# Patient Record
Sex: Female | Born: 1950 | Race: Black or African American | Hispanic: No | Marital: Married | Smoking: Never smoker
Health system: Southern US, Community
[De-identification: ages and names within clinical notes are randomized; demographics above are authoritative.]

## PROBLEM LIST (undated history)

## (undated) DIAGNOSIS — G8929 Other chronic pain: Secondary | ICD-10-CM

## (undated) DIAGNOSIS — I10 Essential (primary) hypertension: Secondary | ICD-10-CM

## (undated) DIAGNOSIS — M25561 Pain in right knee: Secondary | ICD-10-CM

## (undated) HISTORY — PX: ABDOMINAL HYSTERECTOMY: SHX81

---

## 2008-04-11 ENCOUNTER — Ambulatory Visit: Payer: Self-pay

## 2014-12-28 ENCOUNTER — Emergency Department (HOSPITAL_COMMUNITY): Payer: Self-pay

## 2014-12-28 ENCOUNTER — Encounter (HOSPITAL_COMMUNITY): Payer: Self-pay | Admitting: Emergency Medicine

## 2014-12-28 ENCOUNTER — Emergency Department (HOSPITAL_COMMUNITY)
Admission: EM | Admit: 2014-12-28 | Discharge: 2014-12-28 | Disposition: A | Discharge status: Home / Self Care | Payer: Self-pay | Attending: Emergency Medicine | Admitting: Emergency Medicine

## 2014-12-28 DIAGNOSIS — M25561 Pain in right knee: Secondary | ICD-10-CM | POA: Insufficient documentation

## 2014-12-28 DIAGNOSIS — G8929 Other chronic pain: Secondary | ICD-10-CM | POA: Insufficient documentation

## 2014-12-28 DIAGNOSIS — I10 Essential (primary) hypertension: Secondary | ICD-10-CM | POA: Insufficient documentation

## 2014-12-28 DIAGNOSIS — Z79899 Other long term (current) drug therapy: Secondary | ICD-10-CM | POA: Insufficient documentation

## 2014-12-28 HISTORY — DX: Pain in right knee: M25.561

## 2014-12-28 HISTORY — DX: Other chronic pain: G89.29

## 2014-12-28 HISTORY — DX: Essential (primary) hypertension: I10

## 2014-12-28 MED ORDER — NAPROXEN 250 MG PO TABS: 250.0000 mg | ORAL_TABLET | Freq: Two times a day (BID) | ORAL | Status: DC | PRN

## 2014-12-28 MED ORDER — HYDROCODONE-ACETAMINOPHEN 5-325 MG PO TABS: ORAL_TABLET | ORAL | Status: DC

## 2014-12-28 MED ORDER — HYDROCODONE-ACETAMINOPHEN 5-325 MG PO TABS
1.0000 | ORAL_TABLET | Freq: Once | ORAL | Status: DC
  Filled 2014-12-28: qty 1

## 2014-12-28 NOTE — ED Provider Notes (Signed)
CSN: [id number]     Arrival date & time 12/28/14  1238 History   First MD Initiated Contact with Patient 12/28/14 1441     Chief Complaint  Patient presents with  . Knee Pain      HPI Pt was seen at 1450. Per pt, c/o gradual onset and persistence of constant acute flair of her chronic right knee "pain" for the past several months. Pt states "it's my arthritis." States her "PMD doesn't do anything about it." Denies any change from her usual chronic pain. Denies injury, no fevers, no rash, no swelling, no calf/LE pain or unilateral swelling, no focal motor weakness, no tingling/numbness in extremities.    Past Medical History  Diagnosis Date  . Hypertension   . Chronic pain of right knee    Past Surgical History  Procedure Laterality Date  . Abdominal hysterectomy     Family History  Problem Relation Age of Onset  . Hypertension Mother   . Heart failure Mother    History  Substance Use Topics  . Smoking status: Never Smoker   . Smokeless tobacco: Never Used  . Alcohol Use: No   OB History    Gravida Para Term Preterm AB TAB SAB Ectopic Multiple Living   2 2 2             Review of Systems ROS: Statement: All systems negative except as marked or noted in the HPI; Constitutional: Negative for fever and chills. ; ; Eyes: Negative for eye pain, redness and discharge. ; ; ENMT: Negative for ear pain, hoarseness, nasal congestion, sinus pressure and sore throat. ; ; Cardiovascular: Negative for chest pain, palpitations, diaphoresis, dyspnea and peripheral edema. ; ; Respiratory: Negative for cough, wheezing and stridor. ; ; Gastrointestinal: Negative for nausea, vomiting, diarrhea, abdominal pain, blood in stool, hematemesis, jaundice and rectal bleeding. . ; ; Genitourinary: Negative for dysuria, flank pain and hematuria. ; ; Musculoskeletal: +knee pain. Negative for back pain and neck pain. Negative for swelling and trauma.; ; Skin: Negative for pruritus, rash, abrasions, blisters,  bruising and skin lesion.; ; Neuro: Negative for headache, lightheadedness and neck stiffness. Negative for weakness, altered level of consciousness , altered mental status, extremity weakness, paresthesias, involuntary movement, seizure and syncope.        Allergies  Review of patient's allergies indicates no known allergies.  Home Medications   Prior to Admission medications   Medication Sig Start Date End Date Taking? Authorizing Provider  hydrochlorothiazide (HYDRODIURIL) 25 MG tablet Take 1 tablet by mouth daily. 11/13/14  Yes Historical Provider, MD  metoprolol tartrate (LOPRESSOR) 25 MG tablet Take 1 tablet by mouth 2 (two) times daily. 11/25/14  Yes Historical Provider, MD   BP 128/71 mmHg  Pulse 63  Temp(Src) 98 F (36.7 C) (Oral)  Resp 18  Ht 5\' 2"  (1.575 m)  Wt 280 lb (127.007 kg)  BMI 51.20 kg/m2  SpO2 100% Physical Exam  1455; Physical examination:  Nursing notes reviewed; Vital signs and O2 SAT reviewed;  Constitutional: Well developed, Well nourished, Well hydrated, In no acute distress; Head:  Normocephalic, atraumatic; Eyes: EOMI, PERRL, No scleral icterus; ENMT: Mouth and pharynx normal, Mucous membranes moist; Neck: Supple, Full range of motion, No lymphadenopathy; Cardiovascular: Regular rate and rhythm, No gallop; Respiratory: Breath sounds clear & equal bilaterally, No wheezes.  Speaking full sentences with ease, Normal respiratory effort/excursion; Chest: Nontender, Movement normal; Abdomen: Soft, Nontender, Nondistended, Normal bowel sounds; Genitourinary: No CVA tenderness; Extremities: Pulses normal, No deformity. +FROM right  knee, including able to lift extended RLE against gravity, and extend right lower leg against resistance.  No ligamentous laxity.  No patellar or quad tendon step-offs.  NMS intact right foot, strong pedal pp. +plantarflexion of right foot w/calf squeeze.  No palpable gap right Achilles's tendon.  No proximal fibular head tenderness.  No edema,  erythema, warmth, ecchymosis or deformity.  No specific area of point tenderness.  No edema, No calf edema or asymmetry.; Neuro: AA&Ox3, Major CN grossly intact.  Speech clear. No gross focal motor or sensory deficits in extremities. Climbs on and off stretcher easily by herself. Gait steady.; Skin: Color normal, Warm, Dry.   ED Course  Procedures     EKG Interpretation None      MDM  MDM Reviewed: nursing note and vitals Interpretation: x-ray      Dg Knee Complete 4 Views Right 12/28/2014   CLINICAL DATA:  Right knee pain, no known injury, initial encounter  EXAM: RIGHT KNEE - COMPLETE 4+ VIEW  COMPARISON:  None.  FINDINGS: Degenerative changes are noted in all 3 joint compartments but most marked in the medial joint compartment. No significant joint effusion is noted. No soft tissue abnormality is seen.  IMPRESSION: Degenerative change without acute abnormality.   Electronically Signed   By: Alcide Clever M.D.   On: 12/28/2014 15:34    1540:  XR with DJD; tx symptomatically at this time. Dx and testing d/w pt.  Questions answered.  Verb understanding, agreeable to d/c home with outpt f/u.   Samuel Jester, DO 12/30/14 2151

## 2014-12-28 NOTE — Discharge Instructions (Signed)
°Emergency Department Resource Guide °1) Find a Doctor and Pay Out of Pocket °Although you won't have to find out who is covered by your insurance plan, it is a good idea to ask around and get recommendations. You will then need to call the office and see if the doctor you have chosen will accept you as a new patient and what types of options they offer for patients who are self-pay. Some doctors offer discounts or will set up payment plans for their patients who do not have insurance, but you will need to ask so you aren't surprised when you get to your appointment. ° °2) Contact Your Local Health Department °Not all health departments have doctors that can see patients for sick visits, but many do, so it is worth a call to see if yours does. If you don't know where your local health department is, you can check in your phone book. The CDC also has a tool to help you locate your state's health department, and many state websites also have listings of all of their local health departments. ° °3) Find a Walk-in Clinic °If your illness is not likely to be very severe or complicated, you may want to try a walk in clinic. These are popping up all over the country in pharmacies, drugstores, and shopping centers. They're usually staffed by nurse practitioners or physician assistants that have been trained to treat common illnesses and complaints. They're usually fairly quick and inexpensive. However, if you have serious medical issues or chronic medical problems, these are probably not your best option. ° °No Primary Care Doctor: °- Call [facility] Connect at  832-8000 - they can help you locate a primary care doctor that  accepts your insurance, provides certain services, etc. °- Physician Referral Service- 1-800-533-3463 ° °Chronic Pain Problems: °Organization         Address  Phone   Notes  °[facility]  (336) 297-2271 Patients need to be referred by their primary care doctor.  ° °Medication  Assistance: °Organization         Address  Phone   Notes  °[place] [facility] [address] °[place], [place] [address] (336) 641-8030 --Must be a resident of [place] °-- Must have NO insurance coverage whatsoever (no Medicaid/ Medicare, etc.) °-- The pt. MUST have a primary care doctor that directs their care regularly and follows them in the community °  °[facility]  (866) 331-1348   °[employer]  (888) 892-1162   ° °Agencies that provide inexpensive medical care: °Organization         Address  Phone   Notes  °[facility]  (336) 832-8035   °[facility]    (336) 832-7272   °[facility] [address] °[place], [place] [address] (336) 832-4777   °[facility] [address], °[place] (336) 271-4999   °[facility]    (336) 373-0678   °[facility]    (336) 272-1050   °[facility] ° [address], [place] Phone:  (336) 832-4444, Fax:  (336) 832-4440 Hours of Operation:  9 am - 6 pm, M-F.  Also accepts Medicaid/Medicare and self-pay.  °[facility] ° [address], [place] Phone: (336) 832-3150, Fax: (336) [phone]. Hours of Operation:  8:30 am - 5:30 pm, M-F.  Also accepts Medicaid and self-pay.  °[facility] [place] [address], [place] Phone: (336) 878-6027   °[facility] [address], [place], [place] (336)723-1848, Ext. 123 Mondays & Thursdays: 7-9 AM.  First 15 patients are seen on a first come, first serve basis. °  ° °Medicaid-accepting [place] Providers: ° °Organization         Address  Phone   Notes  °[facility] [address], [place] (336) 641-2100 Also accepts self-pay patients.  °[facility] [address], [place] ° (336) 856-9996   °[facility] [address], [place]  (336) 288-8857   °[facility] [address] [place] [address], [place] (336) 299-7000   °Veita Bland [address], [place]  ° (336) 373-1557 Only accepts [place] Access Medicaid patients after they have their name applied to their card.  ° °Self-Pay (no insurance) in [place]: ° °Organization         Address  Phone   Notes  °Sickle Cell Patients, [facility] [address], [place] (336) 832-1970   °[facility] [address], [place] (336) 832-4400   °[facility] ° [address], [place] (336) 992-4800   °[facility]/Dr. Osei-Bonsu ° [address] [place] [address], [place] or [address], [place] (336) 841-8500 Phone number for both [place] and [place] locations is the same.  °[facility] [address], [place] (336) 299-0000   °[facility] [address] [place] [address], [place] or [address] (336) 852-7530 °(336) 878-2260   °[facility] [address], [place] (336) 350-1642, phone; (336) 294-5005, fax Sees patients 1st and 3rd Saturday of every month.  Must not qualify for public or private insurance (i.e. Medicaid, Medicare, [place] Health Choice, Veterans' Benefits) • Household income should be no more than 200% of the poverty level •The clinic cannot treat you if you are pregnant or think you are pregnant • Sexually transmitted diseases are not treated at the clinic.  ° ° °Dental Care: °Organization         Address  Phone  Notes  °[place] [facility] [address], [place] (336) 641-6152 Accepts children up to age 21 who are enrolled in Medicaid or [place] Health Choice; pregnant women with a Medicaid card; and children who have applied for Medicaid or [place] Health Choice, but were declined, whose parents can pay a reduced fee at time of service.  °[place]  [facility] [place]  [address], [place] (336) 641-7733 Accepts children up to age 21 who are enrolled in Medicaid or [place] Health Choice; pregnant women with a Medicaid card; and children who have applied for Medicaid or [place] Health Choice, but were declined, whose parents can pay a reduced fee at time of service.  °[facility] ° [address], [place] (336) 641-4533 Patients are seen by appointment only. Walk-ins are not accepted. [facility] will see patients 18 years of age and older. °Monday - Tuesday (8am-5pm) °Most Wednesdays (8:30-5pm) °$30 per visit, cash only  °[facility] ° [address], [place] (336) 641-4533 Patients are seen by appointment only. Walk-ins are not accepted. [facility] will see patients 18 years of age and older. °One   Wednesday Evening (Monthly: Volunteer Based).  $30 per visit, cash only  °[facility]  (919) 537-3737 for adults; Children under age 4, call Graduate Pediatric Dentistry at (919) [phone]. Children aged 4-14, please call (919) [phone] to request a pediatric application. ° Dental services are provided in all areas of dental care including fillings, crowns and bridges, complete and partial dentures, implants, gum treatment, root canals, and extractions. Preventive care is also provided. Treatment is provided to both adults and children. °Patients are selected via a lottery and there is often a waiting list. °  °[facility] [address], °[place] ° (336) 763-8833 [email or web address] °  °[facility] [address], [place], [place] (336)723-1848, Ext. 123 Second and Fourth Thursday of each month, opens at 6:30 AM; Clinic ends at 9 AM.  Patients are seen on a first-come first-served basis, and a limited number are seen during each clinic.  ° °[facility] ° [address], [place], [place] (336) 723-7904    Eligibility Requirements °You must have lived in [place], [place], or [place] counties for at least the last three months. °  You cannot be eligible for state or federal sponsored healthcare insurance, including Veterans Administration, Medicaid, or Medicare. °  You generally cannot be eligible for healthcare insurance through your employer.  °  How to apply: °Eligibility screenings are held every Tuesday and Wednesday afternoon from 1:00 pm until 4:00 pm. You do not need an appointment for the interview!  °[facility] [address], [place], [place] 336-631-2330   °[facility]  336-342-8273   °[facility]  336-703-3100   °[facility]  336-570-6415   ° °Behavioral Health Resources in the Community: °Intensive Outpatient Programs °Organization         Address  Phone  Notes  °[facility] [address], [place], [place] 336-878-6098   °[facility] [address], [place], [place] 336-832-9800   °[facility] [address], [place], [place] ° 336-882-2125   °[facility] [address],  °[place], [place] 1-800-853-5163 or 336-641-4981   °Substance Abuse Resources °Organization         Address  Phone  Notes  °[facility]  336-882-2125   °[facility]  336-784-9470   °[facility]  336-285-9073   °[facility]  336-845-3988   °Residential & Outpatient Substance Abuse Program  1-800-659-3381   °Psychological Services °Organization         Address  Phone  Notes  °[facility]  336- 832-9600   °[facility]  336- 378-7881   °[facility] [address], [place] 1-800-853-5163 or 336-641-4981   ° °Mobile Crisis Teams °Organization         Address  Phone  Notes  °[facility], Mobile Crisis Care Unit  1-877-626-1772   °Assertive °[facility] ° [address]  [place], [place] 336-834-9664   °Sharon DeEsch [address] °[place] 336-554-5454   ° °Self-Help/Support Groups °Organization         Address  Phone             Notes  °[facility] of  - variety of support groups  336- 373-1402 Call for more information  °Narcotics Anonymous (NA), [facility] [address], °[place]  2 meetings at this location  ° °  Residential Treatment Programs Organization         Address  Phone  Notes  [facility] [address],    [place]  [phone]   [facility]  [address], [place] [phone], [place], [place] [phone]   [facility] [address], [place] [phone] Admissions: 8am-3pm M-F  [facility] [address]    [place], [place] 295-284-1324(301)325-1223   [facility] [address], [place], [place] 401-027-2536(305)[phone]   [facility] [address]  [place], [place] [phone]   [facility] Intensive Outpatient [address], [place], [place] [phone]) [phone]   [facility] ([facility] [address]  [place], [place] [phone] or ([phone]   [facility] ([facility]) [address] [place], [place] [phone] Accepts Medicaid  [facility] [address]  [place] [phone] Substance Abuse/Addiction Treatment   [facility] Organization         Address  Phone  Notes  [facility]  (707)624-9960(888) (442)[phone]   Angie FavaJulie Brannon, PhD [address], [address] [place], [place]   [phone](336) [phone] or [phone](336) [phone]   [facility]   [address] [place], [place] ([phone](336) [phone]   [facility] [address], [place], [place] [phone](336) ([phone] Insurance/Medicaid/sponsorship through [facility] [address]                                    [place], [place] ([phone]) [phone](336) [phone] Therapy/tele-psych/case    [facility] [address]   [place], [place] ([phone](336) ([phone]    Dr. Lolly MustacheArfeen  ([phone](336) [phone]   [facility]  [employer] [facility] 1) [address], [place] 2) [address], [place] 3)  [address], [place] [phone](336) [phone](336) [phone]  [phone](336) [phone]   [facility] Child Abuse Hot[facility] (832) 716-9878(336) 8283122899 or (401)126-0310(336) 201-052-4015 (After Hours)      Take the prescriptions as directed.  Apply moist heat or ice to the area(s) of discomfort, for 15 minutes at a time, several times per day for the next few days.  Do not fall asleep on a heating or ice pack.  Call your regular medical doctor and the Orthopedic doctor today to schedule a follow up appointment within the next week.  Return to the Emergency Department immediately if worsening.

## 2014-12-28 NOTE — ED Notes (Signed)
Pt reports onset of worsening leg pain on Monday.

## 2015-05-15 ENCOUNTER — Ambulatory Visit: Payer: Self-pay | Admitting: Orthopedic Surgery

## 2015-06-13 ENCOUNTER — Ambulatory Visit: Payer: Self-pay | Admitting: Orthopedic Surgery

## 2015-08-17 ENCOUNTER — Ambulatory Visit: Payer: Self-pay | Admitting: Orthopedic Surgery

## 2015-08-24 ENCOUNTER — Ambulatory Visit (INDEPENDENT_AMBULATORY_CARE_PROVIDER_SITE_OTHER): Payer: Medicare HMO | Admitting: Orthopedic Surgery

## 2015-08-24 ENCOUNTER — Encounter: Payer: Self-pay | Admitting: Orthopedic Surgery

## 2015-08-24 VITALS — BP 121/75 | Ht 62.0 in | Wt 274.0 lb

## 2015-08-24 DIAGNOSIS — M1711 Unilateral primary osteoarthritis, right knee: Secondary | ICD-10-CM

## 2015-08-24 MED ORDER — DICLOFENAC POTASSIUM 50 MG PO TABS: 50.0000 mg | ORAL_TABLET | Freq: Two times a day (BID) | ORAL | Status: DC

## 2015-08-24 NOTE — Progress Notes (Signed)
Patient ID: Amanda PennaSophia Alvarado, female   DOB: [birth year], 64 y.o.   MRN: [id number]  Chief Complaint  Patient presents with  . Knee Pain    er follow up, right knee pain, no known injury    HPI Amanda PennaSophia Amanda Alvarado is a 64 y.o. female.  The patient [place] [facility]  She is 64 years old she presents with atraumatic onset of diffuse aching moderately severe right knee pain for several years worse in the last few weeks requiring a visit to the emergency room.  No prior treatment  She has hypertension  Review of Systems Review of Systems  Constitutional: Negative for chills.  Skin: Negative.   Neurological: Negative for numbness.    Past Medical History  Diagnosis Date  . Hypertension   . Chronic pain of right knee     Past Surgical History  Procedure Laterality Date  . Abdominal hysterectomy      Family History  Problem Relation Age of Onset  . Hypertension Mother   . Heart failure Mother     Social History Social History  Substance Use Topics  . Smoking status: Never Smoker   . Smokeless tobacco: Never Used  . Alcohol Use: No    No Known Allergies  Current Outpatient Prescriptions  Medication Sig Dispense Refill  . hydrochlorothiazide (HYDRODIURIL) 25 MG tablet Take 1 tablet by mouth daily.    . metoprolol tartrate (LOPRESSOR) 25 MG tablet Take 1 tablet by mouth 2 (two) times daily.  1  . diclofenac (CATAFLAM) 50 MG tablet Take 1 tablet (50 mg total) by mouth 2 (two) times daily. 60 tablet 5   No current facility-administered medications for this visit.       Physical Exam Physical Exam Blood pressure 121/75, height 5\' 2"  (1.575 m), weight 274 lb (124.286 kg). Appearance, there are no abnormalities in terms of appearance the patient was well-developed and well-nourished. The grooming and hygiene were normal.  Mental status orientation, there was normal alertness and orientation Mood  pleasant Ambulatory status normal with no assistive devices  Examination of the right knee Inspection medial and lateral joint line pain no effusion Range of motion 120 Tests for stability collateral ligament stable fissure ligament stable Motor strength  quadriceps grade 5 strength Skin warm dry and intact without laceration or ulceration or erythema Neurologic examination normal sensation Vascular examination normal pulses with warm extremity and normal capillary refill  The opposite knee reveals no swelling or effusion, full range of motion, normal muscle tone, anterior and posterior drawer test stable neurovascular exam intact  Data Reviewed X-rays 4 views of the right knee 3 compartment disease is noted by my review of this x-ray and independent interpretation she still maintains a valgus knee by 1-2 patellofemoral joint looks fairly well without getting a sunrise view my interpretation is that she has primarily medial compartment arthrosis with 3 compartment disease  Assessment  Encounter Diagnosis  Name Primary?  . Primary osteoarthritis of right knee Yes      Plan  Weight loss NSAIDs Cortisone injection Exercise Return 6 months   Procedure note right knee injection verbal consent was obtained to inject right knee joint  Timeout was completed to confirm the site of injection  The medications used were 40 mg of Depo-Medrol and 1% lidocaine 3 cc  Anesthesia was provided by ethyl chloride and the skin was prepped with alcohol.  After cleaning the skin with alcohol a 20-gauge needle was used to  inject the right knee joint. There were no complications. A sterile bandage was applied.

## 2015-08-24 NOTE — Patient Instructions (Addendum)
Work on weight loss, goal 10 pounds  New medication sent to your pharmacy   Joint Injection Care After Refer to this sheet in the next few days. These instructions provide you with information on caring for yourself after you have had a joint injection. Your caregiver also may give you more specific instructions. Your treatment has been planned according to current medical practices, but problems sometimes occur. Call your caregiver if you have any problems or questions after your procedure. After any type of joint injection, it is not uncommon to experience:  Soreness, swelling, or bruising around the injection site.  Mild numbness, tingling, or weakness around the injection site caused by the numbing medicine used before or with the injection. It also is possible to experience the following effects associated with the specific agent after injection:  Iodine-based contrast agents:  Allergic reaction (itching, hives, widespread redness, and swelling beyond the injection site).  Corticosteroids (These effects are rare.):  Allergic reaction.  Increased blood sugar levels (If you have diabetes and you notice that your blood sugar levels have increased, notify your caregiver).  Increased blood pressure levels.  Mood swings.  Hyaluronic acid in the use of viscosupplementation.  Temporary heat or redness.  Temporary rash and itching.  Increased fluid accumulation in the injected joint. These effects all should resolve within a day after your procedure.  HOME CARE INSTRUCTIONS  Limit yourself to light activity the day of your procedure. Avoid lifting heavy objects, bending, stooping, or twisting.  Take prescription or over-the-counter pain medication as directed by your caregiver.  You may apply ice to your injection site to reduce pain and swelling the day of your procedure. Ice may be applied 03-04 times:  Put ice in a plastic bag.  Place a towel between your skin and the  bag.  Leave the ice on for no longer than 15-20 minutes each time. SEEK IMMEDIATE MEDICAL CARE IF:   Pain and swelling get worse rather than better or extend beyond the injection site.  Numbness does not go away.  Blood or fluid continues to leak from the injection site.  You have chest pain.  You have swelling of your face or tongue.  You have trouble breathing or you become dizzy.  You develop a fever, chills, or severe tenderness at the injection site that last longer than 1 day. MAKE SURE YOU:  Understand these instructions.  Watch your condition.  Get help right away if you are not doing well or if you get worse. Document Released: 06/27/2011 Document Revised: 01/06/2012 Document Reviewed: 06/27/2011 [facility] Patient Information 2015 [place], [place]. This information is not intended to replace advice given to you by your health care provider. Make sure you discuss any questions you have with your health care provider.

## 2016-02-15 ENCOUNTER — Telehealth: Payer: Self-pay | Admitting: Orthopedic Surgery

## 2016-02-15 ENCOUNTER — Other Ambulatory Visit: Payer: Self-pay | Admitting: Orthopedic Surgery

## 2016-02-15 NOTE — Telephone Encounter (Signed)
Patient requests refill on Diclofenac 50 mgs. Qty 60    Sig: Take 1 tablet (50 mg total) by mouth 2 (two) times daily.

## 2016-02-16 ENCOUNTER — Other Ambulatory Visit: Payer: Self-pay | Admitting: *Deleted

## 2016-02-16 MED ORDER — DICLOFENAC POTASSIUM 50 MG PO TABS: 50.0000 mg | ORAL_TABLET | Freq: Two times a day (BID) | ORAL | Status: DC

## 2016-02-16 NOTE — Telephone Encounter (Signed)
YES

## 2016-02-16 NOTE — Telephone Encounter (Signed)
Done

## 2016-02-16 NOTE — Telephone Encounter (Signed)
Ok to refill 

## 2016-02-22 ENCOUNTER — Ambulatory Visit (INDEPENDENT_AMBULATORY_CARE_PROVIDER_SITE_OTHER): Payer: Medicare HMO | Admitting: Orthopedic Surgery

## 2016-02-22 VITALS — BP 129/91 | HR 71 | Ht 62.0 in | Wt 287.0 lb

## 2016-02-22 DIAGNOSIS — M1711 Unilateral primary osteoarthritis, right knee: Secondary | ICD-10-CM | POA: Diagnosis not present

## 2016-02-22 NOTE — Patient Instructions (Signed)

## 2016-02-22 NOTE — Progress Notes (Signed)
Patient ID: Amanda PennaSophia Alvarado, female   DOB: [birth year], 65 y.o.   MRN: [id number]  Chief Complaint  Patient presents with  . Follow-up    6 month follow up right knee    HPI-65 years old received an injection in her right knee. She has medial knee pain some mild symptoms of giving way no significant catching or locking  ROS-no joint effusion, no numbness or tingling  BP 129/91 mmHg  Pulse 71  Ht 5\' 2"  (1.575 m)  Wt 287 lb (130.182 kg)  BMI 52.48 kg/m2  Physical Exam  Constitutional: She is oriented to person, place, and time. She appears well-developed and well-nourished. No distress.  Cardiovascular: Normal rate and intact distal pulses.   Neurological: She is alert and oriented to person, place, and time. She has normal reflexes. She exhibits normal muscle tone. Coordination normal.  Skin: Skin is warm and dry. No rash noted. She is not diaphoretic. No erythema. No pallor.  Psychiatric: She has a normal mood and affect. Her behavior is normal. Judgment and thought content normal.    Ortho Exam No joint effusion medial joint line tenderness is noted we can flex and 120 with stable skin is intact   ASSESSMENT AND PLAN   Diagnosis osteoarthritis right knee  I reviewed her x-ray again she has a lot of medial compartment disease osteophyte formation joint space narrowing there is some tibial spine changes as well  She is interested in another injection I think that would help her  Repeat injection follow-up in 6 months with xrays   Procedure note right knee injection verbal consent was obtained to inject right knee joint  Timeout was completed to confirm the site of injection  The medications used were 40 mg of Depo-Medrol and 1% lidocaine 3 cc  Anesthesia was provided by ethyl chloride and the skin was prepped with alcohol.  After cleaning the skin with alcohol a 20-gauge needle was used to inject the right knee joint. There were no complications. A sterile bandage was  applied.

## 2016-08-12 ENCOUNTER — Ambulatory Visit (INDEPENDENT_AMBULATORY_CARE_PROVIDER_SITE_OTHER): Payer: Self-pay

## 2016-08-12 ENCOUNTER — Encounter: Payer: Self-pay | Admitting: Orthopedic Surgery

## 2016-08-12 ENCOUNTER — Ambulatory Visit (INDEPENDENT_AMBULATORY_CARE_PROVIDER_SITE_OTHER): Payer: Medicare HMO | Admitting: Orthopedic Surgery

## 2016-08-12 VITALS — BP 149/80 | HR 67 | Wt 278.0 lb

## 2016-08-12 DIAGNOSIS — M1711 Unilateral primary osteoarthritis, right knee: Secondary | ICD-10-CM

## 2016-08-12 NOTE — Progress Notes (Signed)
Patient ID: Amanda PennaSophia Alvarado, female   DOB: 06/23/1951, 65 y.o.   MRN: [id number]  Chief Complaint  Patient presents with  . Follow-up    RIGHT KNEE OA    HPI Amanda Alvarado is a 65 y.o. female.   HPI 65 years old follow for arthritis currently on diclofenac received one cortisone injection for osteoarthritis right knee comes in for follow-up x-ray still complaining of right knee pain, dull ache moderate over a year worse with weightbearing  Review of Systems Review of Systems Normal neuro  Denies fever  Past Medical History:  Diagnosis Date  . Chronic pain of right knee   . Hypertension     Examination BP (!) 149/80   Pulse 67   Wt 278 lb (126.1 kg)   BMI 50.85 kg/m   Gen. appearance the patient's appearance is normal with normal grooming and  hygiene The patient is oriented to person place and time Mood and affect are normal   Ortho Exam Gait is remarkable for slight limp Right knee Inspection reveals tender lateral compartment no effusion ROM is 110 Stability tests are normal  Motor exam 5/5 manual muscle testing , no atrophy  Skin is normal (no rash or erythema)   Left knee No tenderness, no swelling. No instability. Motor exam normal skin exam normal   Medical decision-making Diagnosis, Data, Plan (risk)  Plain films show lateral compartment and patellofemoral compartment arthritis  Recommend injection Recommend weight loss, Use medication if needed Follow-up in a year  Procedure note left knee injection verbal consent was obtained to inject left knee joint  Timeout was completed to confirm the site of injection  The medications used were 40 mg of Depo-Medrol and 1% lidocaine 3 cc  Anesthesia was provided by ethyl chloride and the skin was prepped with alcohol.  After cleaning the skin with alcohol a 20-gauge needle was used to inject the left knee joint. There were no complications. A sterile bandage was applied.       Fuller CanadaStanley Buell Parcel,  MD 08/12/2016 10:50 AM

## 2016-08-12 NOTE — Patient Instructions (Signed)

## 2016-08-27 ENCOUNTER — Ambulatory Visit: Payer: Medicare HMO | Admitting: Orthopaedic Surgery

## 2017-07-05 ENCOUNTER — Emergency Department (HOSPITAL_COMMUNITY): Payer: Medicare Other

## 2017-07-05 ENCOUNTER — Emergency Department (HOSPITAL_COMMUNITY)
Admission: EM | Admit: 2017-07-05 | Discharge: 2017-07-05 | Disposition: A | Discharge status: Home / Self Care | Payer: Medicare Other | Attending: Emergency Medicine | Admitting: Emergency Medicine

## 2017-07-05 ENCOUNTER — Encounter (HOSPITAL_COMMUNITY): Payer: Self-pay

## 2017-07-05 DIAGNOSIS — N200 Calculus of kidney: Secondary | ICD-10-CM | POA: Insufficient documentation

## 2017-07-05 DIAGNOSIS — I10 Essential (primary) hypertension: Secondary | ICD-10-CM | POA: Diagnosis not present

## 2017-07-05 DIAGNOSIS — R1031 Right lower quadrant pain: Secondary | ICD-10-CM | POA: Diagnosis present

## 2017-07-05 DIAGNOSIS — Z79899 Other long term (current) drug therapy: Secondary | ICD-10-CM | POA: Insufficient documentation

## 2017-07-05 LAB — COMPREHENSIVE METABOLIC PANEL
ALBUMIN: 3.7 g/dL (ref 3.5–5.0)
ALT: 18 U/L (ref 14–54)
ANION GAP: 9 (ref 5–15)
AST: 17 U/L (ref 15–41)
Alkaline Phosphatase: 63 U/L (ref 38–126)
BILIRUBIN TOTAL: 0.5 mg/dL (ref 0.3–1.2)
BUN: 24 mg/dL — AB (ref 6–20)
CO2: 26 mmol/L (ref 22–32)
Calcium: 9.4 mg/dL (ref 8.9–10.3)
Chloride: 104 mmol/L (ref 101–111)
Creatinine, Ser: 0.97 mg/dL (ref 0.44–1.00)
GFR, EST NON AFRICAN AMERICAN: 60 mL/min — AB (ref >60)
Glucose, Bld: 115 mg/dL — ABNORMAL HIGH (ref 65–99)
Potassium: 3.4 mmol/L — ABNORMAL LOW (ref 3.5–5.1)
Sodium: 139 mmol/L (ref 135–145)
Total Protein: 7.5 g/dL (ref 6.5–8.1)

## 2017-07-05 LAB — CBC
HEMATOCRIT: 43.1 % (ref 36.0–46.0)
HEMOGLOBIN: 14.3 g/dL (ref 12.0–15.0)
MCH: 28.9 pg (ref 26.0–34.0)
MCHC: 33.2 g/dL (ref 30.0–36.0)
MCV: 87.2 fL (ref 78.0–100.0)
Platelets: 201 10*3/uL (ref 150–400)
RBC: 4.94 MIL/uL (ref 3.87–5.11)
RDW: 14.4 % (ref 11.5–15.5)
WBC: 5.2 10*3/uL (ref 4.0–10.5)

## 2017-07-05 LAB — URINALYSIS, ROUTINE W REFLEX MICROSCOPIC
Bilirubin Urine: NEGATIVE
Glucose, UA: NEGATIVE mg/dL
Ketones, ur: NEGATIVE mg/dL
Nitrite: NEGATIVE
Protein, ur: NEGATIVE mg/dL
Specific Gravity, Urine: 1.014 (ref 1.005–1.030)
pH: 5 (ref 5.0–8.0)

## 2017-07-05 LAB — DIFFERENTIAL
BASOS ABS: 0 10*3/uL (ref 0.0–0.1)
Basophils Relative: 0 %
Eosinophils Absolute: 0.1 10*3/uL (ref 0.0–0.7)
Eosinophils Relative: 2 %
Lymphocytes Relative: 14 %
Lymphs Abs: 0.7 10*3/uL (ref 0.7–4.0)
Monocytes Absolute: 0.3 10*3/uL (ref 0.1–1.0)
Monocytes Relative: 6 %
NEUTROS PCT: 78 %
Neutro Abs: 4 10*3/uL (ref 1.7–7.7)

## 2017-07-05 LAB — LIPASE, BLOOD: Lipase: 34 U/L (ref 11–51)

## 2017-07-05 MED ORDER — HYDROMORPHONE HCL 1 MG/ML IJ SOLN
0.5000 mg | Freq: Once | INTRAMUSCULAR | Status: AC
  Administered 2017-07-05: 0.5 mg via INTRAVENOUS
  Filled 2017-07-05: qty 1

## 2017-07-05 MED ORDER — ONDANSETRON HCL 4 MG/2ML IJ SOLN
4.0000 mg | Freq: Once | INTRAMUSCULAR | Status: AC
  Administered 2017-07-05: 4 mg via INTRAVENOUS

## 2017-07-05 MED ORDER — TAMSULOSIN HCL 0.4 MG PO CAPS: 0.4000 mg | ORAL_CAPSULE | Freq: Every day | ORAL | 0 refills | Status: AC

## 2017-07-05 MED ORDER — HYDROCODONE-ACETAMINOPHEN 5-325 MG PO TABS: 1.0000 | ORAL_TABLET | ORAL | 0 refills | Status: AC | PRN

## 2017-07-05 MED ORDER — ONDANSETRON HCL 4 MG/2ML IJ SOLN
INTRAMUSCULAR | Status: AC
  Filled 2017-07-05: qty 2

## 2017-07-05 MED ORDER — KETOROLAC TROMETHAMINE 30 MG/ML IJ SOLN
30.0000 mg | Freq: Once | INTRAMUSCULAR | Status: AC
  Administered 2017-07-05: 30 mg via INTRAVENOUS
  Filled 2017-07-05: qty 1

## 2017-07-05 MED ORDER — ONDANSETRON HCL 4 MG PO TABS: 4.0000 mg | ORAL_TABLET | Freq: Three times a day (TID) | ORAL | 0 refills | Status: AC | PRN

## 2017-07-05 MED ORDER — SODIUM CHLORIDE 0.9 % IV BOLUS (SEPSIS)
1000.0000 mL | Freq: Once | INTRAVENOUS | Status: AC
  Administered 2017-07-05: 1000 mL via INTRAVENOUS

## 2017-07-05 NOTE — ED Triage Notes (Addendum)
Pt reports right sided pain since this am. Pain is described as sharp. Reports 2 episodes of vomiting. No diarrhea. Denies urinary symptoms.

## 2017-07-05 NOTE — Discharge Instructions (Signed)
You have a kidney stone on the right side, that looks like it may have just passed in the bladder or about to. Please take pain medications as prescribed.  Follow-up with urology or your primary care doctor  Return for worsening symptoms, including fever, intractable vomiting, escalating pain, or any other symptoms concerning to you.

## 2017-07-05 NOTE — ED Provider Notes (Signed)
[facility]ENCY DEPT Provider Note   CSN: [id number] Arrival date & time: 07/05/17  [id number]     History   Chief Complaint Chief Complaint  Patient presents with  . Abdominal Pain    HPI Amanda Alvarado is a 66 y.o. female.  The history is provided by the patient.  Abdominal Pain   This is a new problem. The current episode started 1 to 2 hours ago. The problem occurs constantly. The problem has not changed since onset.The pain is associated with an unknown factor. The pain is located in the RLQ. The pain is severe. Associated symptoms include nausea and vomiting. Pertinent negatives include fever, diarrhea, hematochezia, melena, dysuria and frequency. The symptoms are aggravated by palpation. Nothing relieves the symptoms.    66 year old female who presents with abdominal pain. She has a history of cholelithiasis and prior abdominal hysterectomy. States that while in bed this morning she had sudden onset of right-sided sharp abdominal pain that started in the right flank and now radiating into the right mid to lower abdomen. Associated with nausea and vomiting. Denies any fevers, diarrhea, dysuria, urinary frequency, or hematuria. No aggravating or alleviating factors. Unsure if this may be similar to previous history of biliary colic. No chest pain or difficulty breathing  Past Medical History:  Diagnosis Date  . Chronic pain of right knee   . Hypertension     There are no active problems to display for this patient.   Past Surgical History:  Procedure Laterality Date  . ABDOMINAL HYSTERECTOMY      OB History    Gravida Para Term Preterm AB Living   2 2 2          SAB TAB Ectopic Multiple Live Births                   Home Medications    Prior to Admission medications   Medication Sig Start Date End Date Taking? Authorizing Provider  hydrochlorothiazide (HYDRODIURIL) 25 MG tablet Take 1 tablet by mouth daily. 11/13/14  Yes [provider]  metoprolol tartrate  (LOPRESSOR) 25 MG tablet Take 1 tablet by mouth 2 (two) times daily. 11/25/14  Yes [provider]  HYDROcodone-acetaminophen (NORCO/VICODIN) 5-325 MG tablet Take 1 tablet by mouth every 4 (four) hours as needed. 07/05/17   Lavera GuiseLiu, Geryl Dohn Duo, MD  ondansetron (ZOFRAN) 4 MG tablet Take 1 tablet (4 mg total) by mouth every 8 (eight) hours as needed for nausea or vomiting. 07/05/17   Lavera GuiseLiu, Tran Arzuaga Duo, MD  tamsulosin (FLOMAX) 0.4 MG CAPS capsule Take 1 capsule (0.4 mg total) by mouth daily. 07/05/17   Lavera GuiseLiu, Zyrah Wiswell Duo, MD    Family History Family History  Problem Relation Age of Onset  . Hypertension Mother   . Heart failure Mother     Social History Social History  Substance Use Topics  . Smoking status: Never Smoker  . Smokeless tobacco: Never Used  . Alcohol use No     Allergies   Penicillins   Review of Systems Review of Systems  Constitutional: Negative for fever.  Gastrointestinal: Positive for abdominal pain, nausea and vomiting. Negative for diarrhea, hematochezia and melena.  Genitourinary: Negative for dysuria and frequency.  All other systems reviewed and are negative.    Physical Exam Updated Vital Signs BP 124/61   Pulse 60   Temp 98 F (36.7 C) (Oral)   Resp 18   Wt 126.1 kg (278 lb)   SpO2 96%   BMI 50.85 kg/m  Physical Exam Physical Exam  Nursing note and vitals reviewed. Constitutional:  non-toxic, and in no acute distress Head: Normocephalic and atraumatic.  Mouth/Throat: Oropharynx is clear and moist.  Neck: Normal range of motion. Neck supple.  Cardiovascular: Normal rate and regular rhythm.   Pulmonary/Chest: Effort normal and breath sounds normal.  Abdominal: Soft. There is right middle to RLQ tenderness. There is no rebound and no guarding. tenderness close to right flank but no CVA tenderness to percussion. No RUQ tenderness Musculoskeletal: Normal range of motion.  Neurological: Alert, no facial droop, fluent speech, moves all extremities  symmetrically Skin: Skin is warm and dry.  Psychiatric: Cooperative   ED Treatments / Results  Labs (all labs ordered are listed, but only abnormal results are displayed) Labs Reviewed  COMPREHENSIVE METABOLIC PANEL - Abnormal; Notable for the following:       Result Value   Potassium 3.4 (*)    Glucose, Bld 115 (*)    BUN 24 (*)    GFR calc non Af Amer 60 (*)    All other components within normal limits  URINALYSIS, ROUTINE W REFLEX MICROSCOPIC - Abnormal; Notable for the following:    APPearance CLOUDY (*)    Hgb urine dipstick LARGE (*)    Leukocytes, UA TRACE (*)    Bacteria, UA RARE (*)    Squamous Epithelial / LPF 0-5 (*)    All other components within normal limits  URINE CULTURE  LIPASE, BLOOD  CBC  DIFFERENTIAL    EKG  EKG Interpretation None       Radiology Ct Renal Stone Study  Result Date: 07/05/2017 CLINICAL DATA:  66 year old female with right flank pain EXAM: CT ABDOMEN AND PELVIS WITHOUT CONTRAST TECHNIQUE: Multidetector CT imaging of the abdomen and pelvis was performed following the standard protocol without IV contrast. COMPARISON:  None. FINDINGS: Lower chest: Mild cardiomegaly. No pericardial effusion. Visualized distal thoracic esophagus is unremarkable. The visualized lower lungs are clear. Hepatobiliary: No focal liver abnormality is seen. No gallstones, gallbladder wall thickening, or biliary dilatation. Pancreas: Unremarkable. No pancreatic ductal dilatation or surrounding inflammatory changes. Spleen: Normal in size without focal abnormality. Adrenals/Urinary Tract: Normal adrenal glands. Mild fullness of the right renal collecting system. There is a tiny punctate stone within the bladder in the region of the UVJ likely representing a recently passed stone. Punctate nonobstructing calculi identified in the lower pole collecting system of the left kidney. Stomach/Bowel: No evidence of obstruction or focal bowel wall thickening. Normal appendix in the  right lower quadrant. The terminal ileum is unremarkable. Vascular/Lymphatic: Limited evaluation in the absence of intravenous contrast. No evidence of aneurysm. Calcifications present along the course of the abdominal aorta. No suspicious lymphadenopathy. Reproductive: Status post hysterectomy. No adnexal masses. Other: No abdominal wall hernia or abnormality. No abdominopelvic ascites. Musculoskeletal: No acute fracture or aggressive appearing lytic or blastic osseous lesion. Moderate degenerative osteoarthritis of the right hip joint. Multilevel lower lumbar facet arthropathy. IMPRESSION: 1. Punctate stone in the bladder may be within the right ureterovesicular junction, or just recently passed and likely represents the source of the patient's reported right flank pain. There is mild associated fullness of the right renal collecting system without hydronephrosis. 2. Additional punctate nonobstructing nephrolithiasis in the lower pole of the left kidney. 3. Mild cardiomegaly. 4.  Aortic Atherosclerosis (ICD10-170.0) 5. Moderate right hip joint osteoarthritis. 6. Multilevel moderate degenerative facet arthropathy. Electronically Signed   By: Malachy Moan M.D.   On: 07/05/2017 11:20    Procedures Procedures (  including critical care time)  Medications Ordered in ED Medications  ondansetron (ZOFRAN) injection 4 mg (4 mg Intravenous Given 07/05/17 1033)  HYDROmorphone (DILAUDID) injection 0.5 mg (0.5 mg Intravenous Given 07/05/17 1051)  sodium chloride 0.9 % bolus 1,000 mL (1,000 mLs Intravenous New Bag/Given 07/05/17 1051)  HYDROmorphone (DILAUDID) injection 0.5 mg (0.5 mg Intravenous Given 07/05/17 1146)  ketorolac (TORADOL) 30 MG/ML injection 30 mg (30 mg Intravenous Given 07/05/17 1347)     Initial Impression / Assessment and Plan / ED Course  I have reviewed the triage vital signs and the nursing notes.  Pertinent labs & imaging results that were available during my care of the patient were reviewed  by me and considered in my medical decision making (see chart for details).     Presenting with right sided abdominal pain of sudden onset with nausea and vomiting. Differential initially including appendictitis, pyelonephritis, urolithiasis. No RUQ tenderness and unlikely cholecystitis or biliary colic.  Afebrile, HD stable. Soft nonsurgical abdomen but with RLQ pain. Blood work reassuring. CT renal stone performed. With small punctate stone at the UVJ or just passed in bladder c/w kidney stone. Will still persistent pain after, so suspect likely more at UVJ. Pain better controlled. UA without obvious infection.  Will discharge with pain control and outpatient follow-up. Strict return and follow-up instructions reviewed. She expressed understanding of all discharge instructions and felt comfortable with the plan of care.   Final Clinical Impressions(s) / ED Diagnoses   Final diagnoses:  Kidney stone    New Prescriptions New Prescriptions   HYDROCODONE-ACETAMINOPHEN (NORCO/VICODIN) 5-325 MG TABLET    Take 1 tablet by mouth every 4 (four) hours as needed.   ONDANSETRON (ZOFRAN) 4 MG TABLET    Take 1 tablet (4 mg total) by mouth every 8 (eight) hours as needed for nausea or vomiting.   TAMSULOSIN (FLOMAX) 0.4 MG CAPS CAPSULE    Take 1 capsule (0.4 mg total) by mouth daily.     Lavera Guise, MD 07/05/17 [phone]

## 2017-07-06 LAB — URINE CULTURE: CULTURE: NO GROWTH

## 2019-04-15 IMAGING — CT CT RENAL STONE PROTOCOL
2 of 4 series · 16 of 46 positions shown, 18 images · non-contrast
Comparison: None.

CLINICAL DATA: 66-year-old female with right flank pain

EXAM:
CT ABDOMEN AND PELVIS WITHOUT CONTRAST
TECHNIQUE: Multidetector CT imaging of the abdomen and pelvis was performed
following the standard protocol without IV contrast.

[Series 2: axial st · axial · 0.83mm/px · z∈[+948,+1353]mm · 13 of 92 slices shown, 15 images]
[im 7/92  soft-tissue]
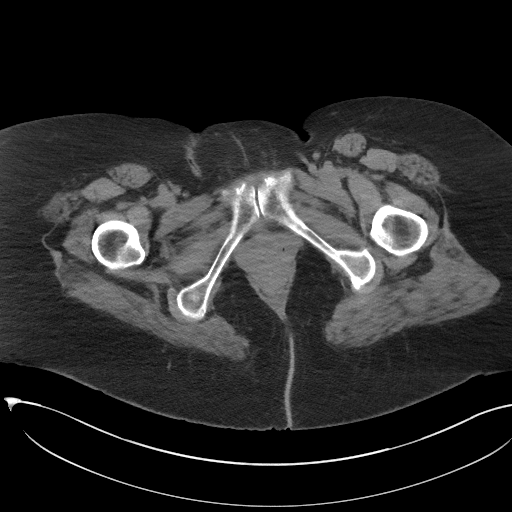
[im 7/92  bone]
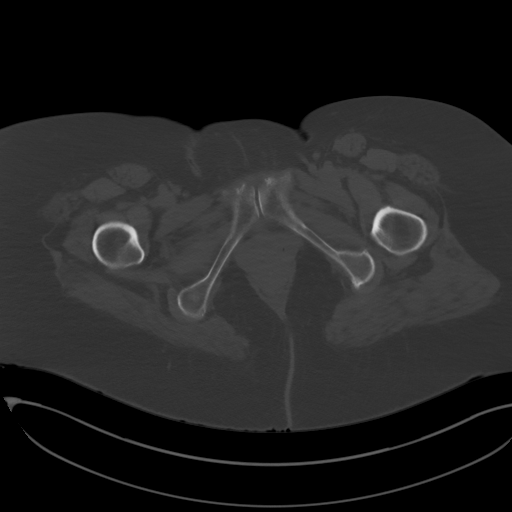
[im 14/92  soft-tissue]
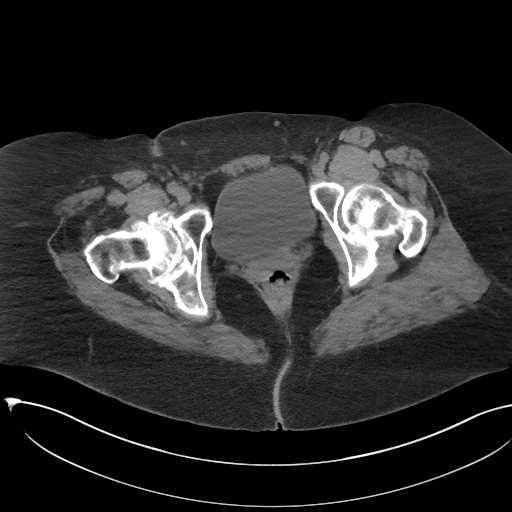
[im 21/92  soft-tissue]
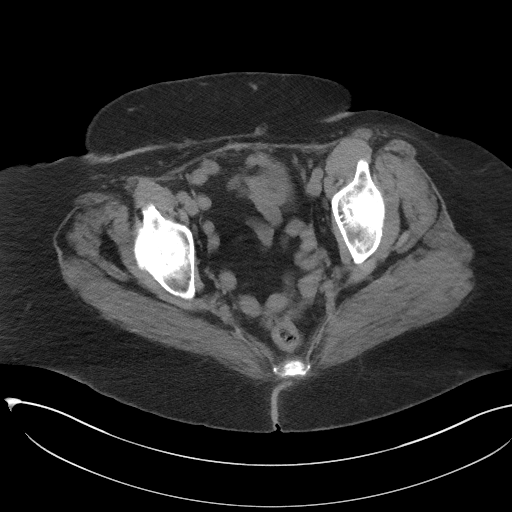
[im 27/92  soft-tissue]
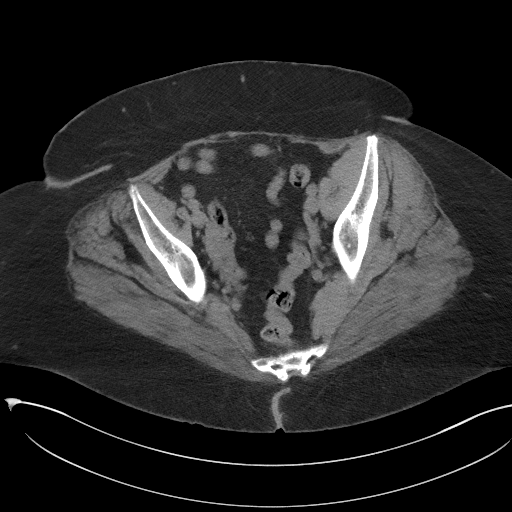
[im 34/92  soft-tissue]
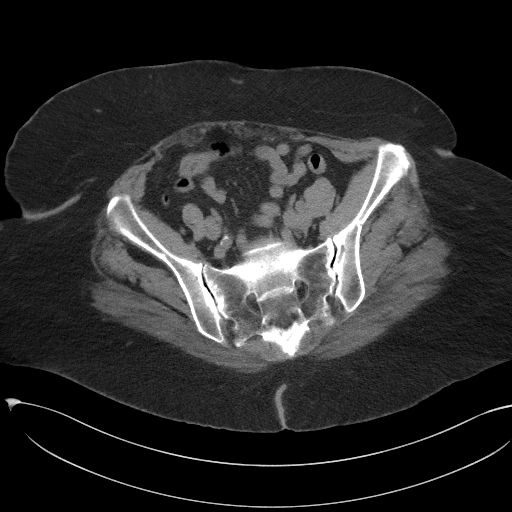
[im 41/92  soft-tissue]
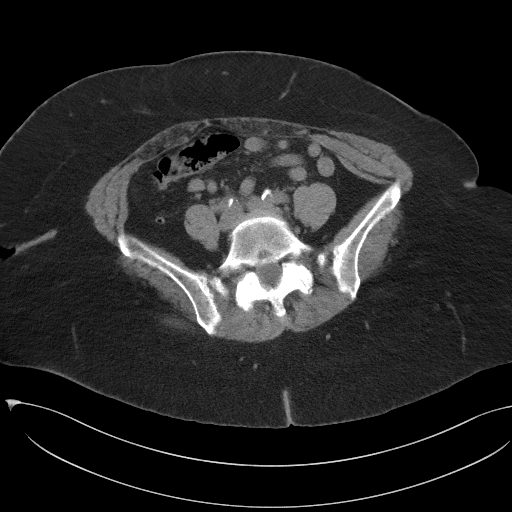
[im 48/92  soft-tissue]
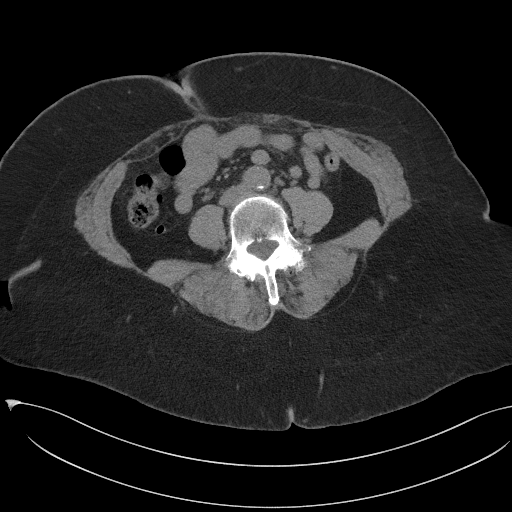
[im 54/92  soft-tissue]
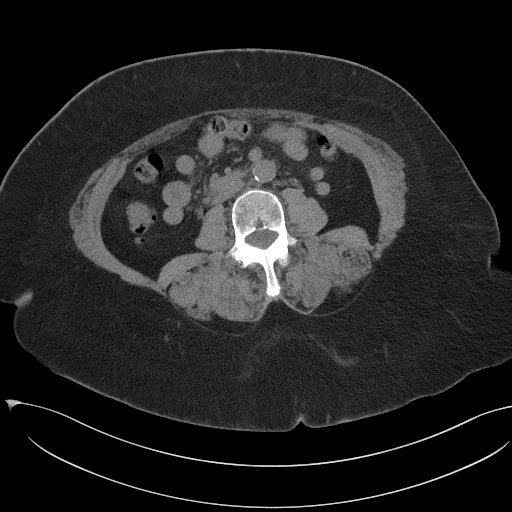
[im 61/92  soft-tissue]
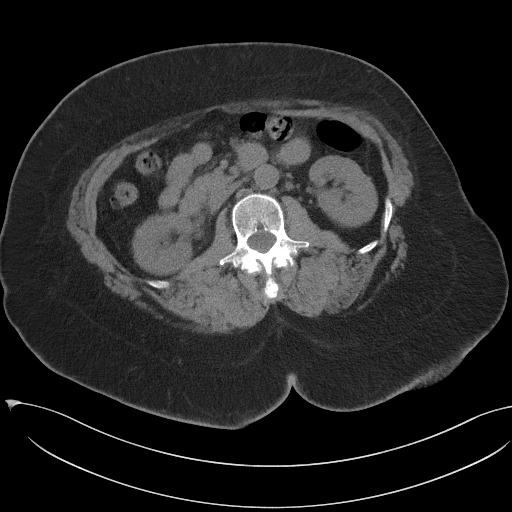
[im 61/92  bone]
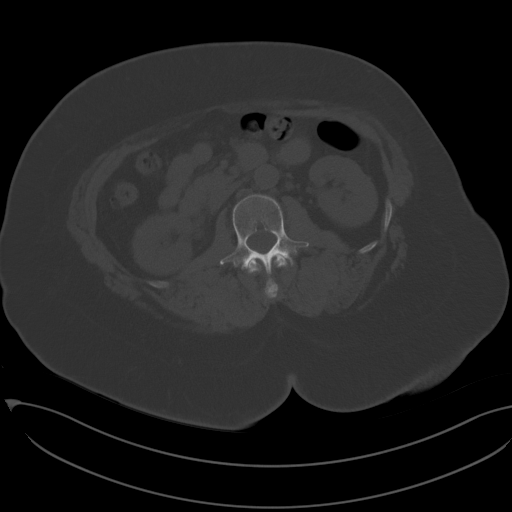
[im 68/92  soft-tissue]
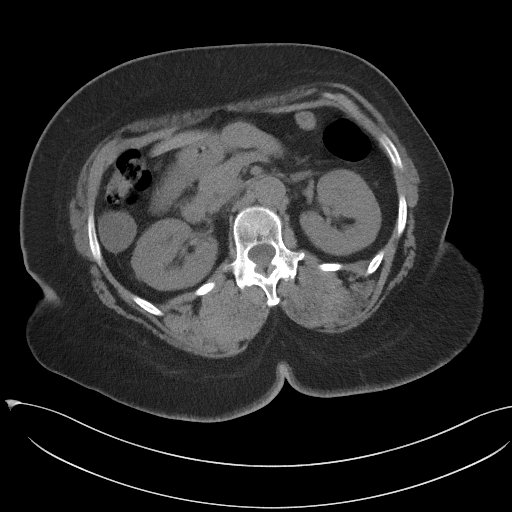
[im 75/92  soft-tissue]
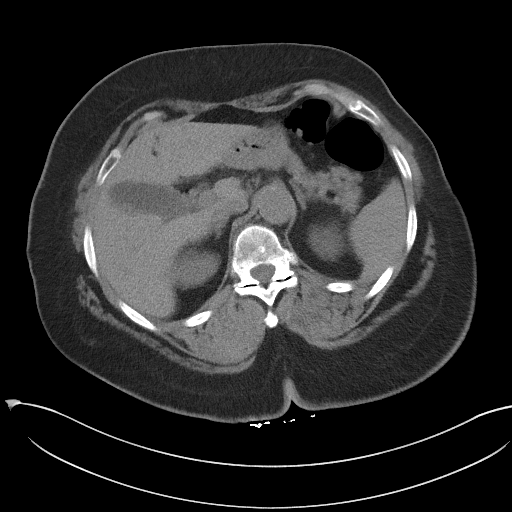
[im 81/92  soft-tissue]
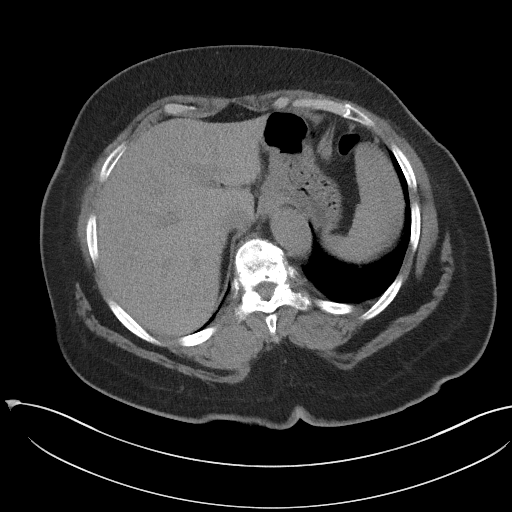
[im 88/92  soft-tissue]
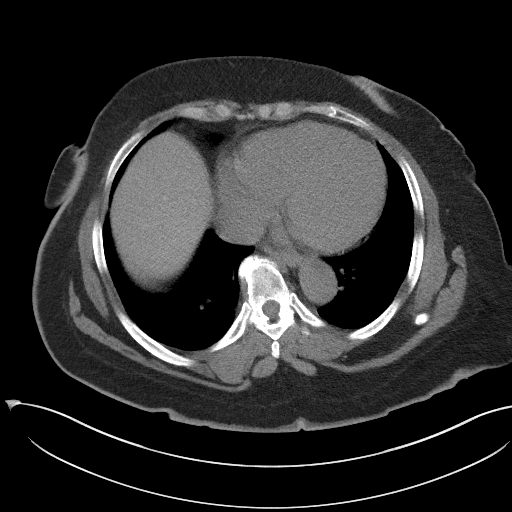

[Series 5: coronal st · coronal · 0.77mm/px · 3 of 86 slices shown]
[im 29/86  soft-tissue]
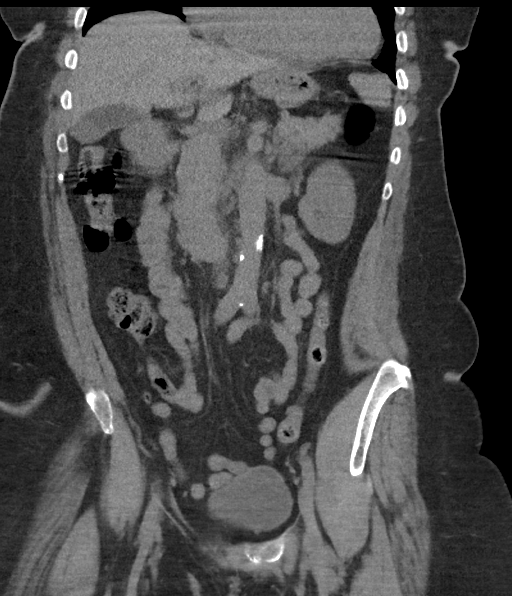
[im 38/86  soft-tissue]
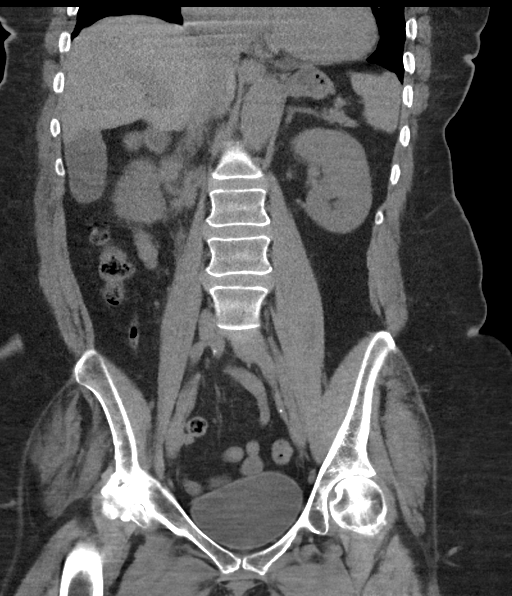
[im 48/86  soft-tissue]
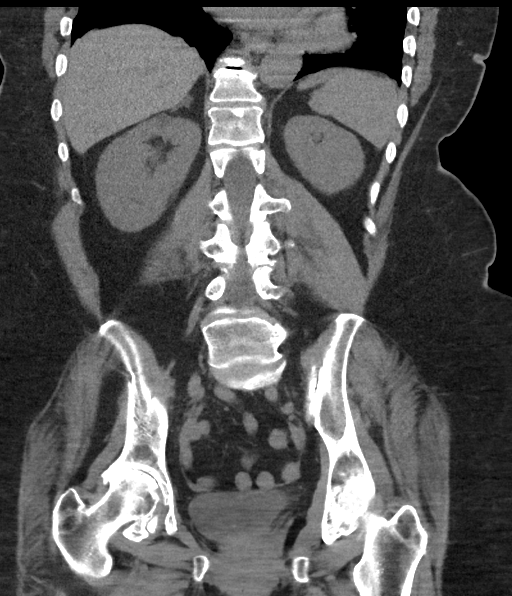

[16 of 46 positions shown; findings below may reference images not displayed]

FINDINGS: Lower chest: Mild cardiomegaly. No pericardial effusion. Visualized
distal thoracic esophagus is unremarkable. The visualized lower
lungs are clear.

Hepatobiliary: No focal liver abnormality is seen. No gallstones,
gallbladder wall thickening, or biliary dilatation.

Pancreas: Unremarkable. No pancreatic ductal dilatation or
surrounding inflammatory changes.

Spleen: Normal in size without focal abnormality.

Adrenals/Urinary Tract: Normal adrenal glands. Mild fullness of the
right renal collecting system. There is a tiny punctate stone within
the bladder in the region of the UVJ likely representing a recently
passed stone. Punctate nonobstructing calculi identified in the
lower pole collecting system of the left kidney.

Stomach/Bowel: No evidence of obstruction or focal bowel wall
thickening. Normal appendix in the right lower quadrant. The
terminal ileum is unremarkable.

Vascular/Lymphatic: Limited evaluation in the absence of intravenous
contrast. No evidence of aneurysm. Calcifications present along the
course of the abdominal aorta. No suspicious lymphadenopathy.

Reproductive: Status post hysterectomy. No adnexal masses.

Other: No abdominal wall hernia or abnormality. No abdominopelvic
ascites.

Musculoskeletal: No acute fracture or aggressive appearing lytic or
blastic osseous lesion. Moderate degenerative osteoarthritis of the
right hip joint. Multilevel lower lumbar facet arthropathy.
IMPRESSION: 1. Punctate stone in the bladder may be within the right
ureterovesicular junction, or just recently passed and likely
represents the source of the patient's reported right flank pain.
There is mild associated fullness of the right renal collecting
system without hydronephrosis.
2. Additional punctate nonobstructing nephrolithiasis in the lower
pole of the left kidney.
3. Mild cardiomegaly.
4.  Aortic Atherosclerosis (540X9-170.0)
5. Moderate right hip joint osteoarthritis.
6. Multilevel moderate degenerative facet arthropathy.
